# Patient Record
Sex: Female | Born: 1995 | Race: Black or African American | Hispanic: No | Marital: Single
Health system: Midwestern US, Community
[De-identification: ages and names within clinical notes are randomized; demographics above are authoritative.]

## PROBLEM LIST (undated history)

## (undated) HISTORY — PX: KNEE SURGERY: SHX244

---

## 2014-06-25 ENCOUNTER — Emergency Department (HOSPITAL_COMMUNITY): Payer: BLUE CROSS/BLUE SHIELD

## 2014-06-25 ENCOUNTER — Emergency Department (HOSPITAL_COMMUNITY)
Admission: EM | Admit: 2014-06-25 | Discharge: 2014-06-25 | Disposition: A | Discharge status: Home / Self Care | Payer: BLUE CROSS/BLUE SHIELD | Attending: Emergency Medicine | Admitting: Emergency Medicine

## 2014-06-25 ENCOUNTER — Encounter (HOSPITAL_COMMUNITY): Payer: Self-pay | Admitting: Emergency Medicine

## 2014-06-25 DIAGNOSIS — E663 Overweight: Secondary | ICD-10-CM | POA: Diagnosis not present

## 2014-06-25 DIAGNOSIS — R0789 Other chest pain: Secondary | ICD-10-CM | POA: Insufficient documentation

## 2014-06-25 DIAGNOSIS — R079 Chest pain, unspecified: Secondary | ICD-10-CM | POA: Diagnosis present

## 2014-06-25 LAB — D-DIMER, QUANTITATIVE: D-Dimer, Quant: <0.27 ug/mL-FEU (ref 0.00–0.48)

## 2014-06-25 LAB — TROPONIN I

## 2014-06-25 MED ORDER — IBUPROFEN 600 MG PO TABS: 600.0000 mg | ORAL_TABLET | Freq: Four times a day (QID) | ORAL | Status: DC | PRN

## 2014-06-25 MED ORDER — HYDROCODONE-ACETAMINOPHEN 5-325 MG PO TABS: 1.0000 | ORAL_TABLET | ORAL | Status: DC | PRN

## 2014-06-25 NOTE — Discharge Instructions (Signed)
Chest Wall Pain °Chest wall pain is pain felt in or around the chest bones and muscles. It may take up to 6 weeks to get better. It may take longer if you are active. Chest wall pain can happen on its own. Other times, things like germs, injury, coughing, or exercise can cause the pain. °HOME CARE  °· Avoid activities that make you tired or cause pain. Try not to use your chest, belly (abdominal), or side muscles. Do not use heavy weights. °· Put ice on the sore area. °¨ Put ice in a plastic bag. °¨ Place a towel between your skin and the bag. °¨ Leave the ice on for 15-20 minutes for the first 2 days. °· Only take medicine as told by your doctor. °GET HELP RIGHT AWAY IF:  °· You have more pain or are very uncomfortable. °· You have a fever. °· Your chest pain gets worse. °· You have new problems. °· You feel sick to your stomach (nauseous) or throw up (vomit). °· You start to sweat or feel lightheaded. °· You have a cough with mucus (phlegm). °· You cough up blood. °MAKE SURE YOU:  °· Understand these instructions. °· Will watch your condition. °· Will get help right away if you are not doing well or get worse. °Document Released: 09/08/2007 Document Revised: 06/14/2011 Document Reviewed: 11/16/2010 °[employer]® Patient Information ©2015 [employer]. This information is not intended to replace advice given to you by your health care provider. Make sure you discuss any questions you have with your health care provider. ° °
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
## 2014-06-25 NOTE — ED Notes (Signed)
Pt taken to XR.  

## 2014-06-25 NOTE — ED Provider Notes (Signed)
CSN: [id number]     Arrival date & time 06/25/14  0142 History  This chart was scribed for Shon Baton, MD by Annye Asa, ED Scribe. This patient was seen in room A01C/A01C and the patient's care was started at 2:10 AM.    Chief Complaint  Patient presents with  . Chest Pain   Patient is a 19 y.o. female presenting with chest pain. The history is provided by the patient. No language interpreter was used.  Chest Pain Associated symptoms: no abdominal pain, no cough, no fever, no headache, no nausea, no shortness of breath and not vomiting      HPI Comments: Judy Mcintosh is an otherwise healthy 19 y.o. female who presents to the Emergency Department complaining of 30 minutes of intermittent, left-sided "aching" chest pain, rated 5/10 at present. Patient states her pain initially worsened with sitting up; it has gradually worsened since that time. It is exacerbated with breathing and applied pressure. No treatments or medications tried PTA. She reports one prior experience with similar symptoms, at which time she was seen by her PCP but did not receive a conclusive diagnosis. She denies abdominal pain, nausea, vomiting, diarrhea, leg pain or swelling. She denies any recent long-term periods of immobility (e.g. no plane or car trips over 6-8 hours). She denies personal or familial history of PE or DVT.   LNMP beginning 06/20/14. Birth control: Nuvaring.   History reviewed. No pertinent past medical history. History reviewed. No pertinent past surgical history. History reviewed. No pertinent family history. History  Substance Use Topics  . Smoking status: Not on file  . Smokeless tobacco: Not on file  . Alcohol Use: Not on file   OB History    No data available     Review of Systems  Constitutional: Negative for fever.  Respiratory: Negative for cough, chest tightness and shortness of breath.   Cardiovascular: Positive for chest pain. Negative for leg swelling.  Gastrointestinal:  Negative for nausea, vomiting and abdominal pain.  Genitourinary: Negative for dysuria.  Skin: Negative for wound.  Neurological: Negative for headaches.  All other systems reviewed and are negative.  Allergies  Review of patient's allergies indicates no known allergies.  Home Medications   Prior to Admission medications   Medication Sig Start Date End Date Taking? Authorizing Provider  etonogestrel-ethinyl estradiol (NUVARING) 0.12-0.015 MG/24HR vaginal ring Place 1 each vaginally every 28 (twenty-eight) days. Insert vaginally and leave in place for 3 consecutive weeks, then remove for 1 week.   Yes Historical Provider, MD  HYDROcodone-acetaminophen (NORCO/VICODIN) 5-325 MG per tablet Take 1 tablet by mouth every 4 (four) hours as needed. 06/25/14   Shon Baton, MD  ibuprofen (ADVIL,MOTRIN) 600 MG tablet Take 1 tablet (600 mg total) by mouth every 6 (six) hours as needed. 06/25/14   Shon Baton, MD   BP 130/76 mmHg  Pulse 79  Temp(Src) 98.2 F (36.8 C) (Oral)  Resp 25  Ht  (1.702 m)  Wt 205 lb (92.987 kg)  BMI 32.10 kg/m2  SpO2 100%  LMP 06/18/2014 (Within Days) Physical Exam  Constitutional: She is oriented to person, place, and time. She appears well-developed and well-nourished.  Overweight  HENT:  Head: Normocephalic and atraumatic.  Eyes: Pupils are equal, round, and reactive to light.  Cardiovascular: Normal rate, regular rhythm and normal heart sounds.   Pulmonary/Chest: Effort normal. No respiratory distress. She has no wheezes. She exhibits tenderness.  Abdominal: Soft. Bowel sounds are normal. There is no tenderness.  There is no rebound.  Musculoskeletal: She exhibits no edema.  Neurological: She is alert and oriented to person, place, and time.  Skin: Skin is warm and dry.  Psychiatric: She has a normal mood and affect.  Nursing note and vitals reviewed.   ED Course  Procedures   DIAGNOSTIC STUDIES: Oxygen Saturation is 99% on RA, normal by my  interpretation.    COORDINATION OF CARE: 2:14 AM Discussed treatment plan with pt at bedside and pt agreed to plan.   Labs Review Labs Reviewed  TROPONIN I  D-DIMER, QUANTITATIVE    Imaging Review Dg Chest 2 View  06/25/2014   CLINICAL DATA:  Left-sided chest pain  EXAM: CHEST  2 VIEW  COMPARISON:  None.  FINDINGS: There is mild cardiomegaly. Lungs are clear. Pulmonary vasculature is normal. There are no pleural effusions.  IMPRESSION: Cardiomegaly.  No acute cardiopulmonary findings   Electronically Signed   By: Ellery Plunkaniel R Mitchell M.D.   On: 06/25/2014 03:06     EKG Interpretation   Date/Time:  Tuesday June 25 2014 01:53:12 EDT Ventricular Rate:  71 PR Interval:  141 QRS Duration: 101 QT Interval:  397 QTC Calculation: 431 R Axis:   86 Text Interpretation:  Sinus arrhythmia Borderline Q waves in inferior  leads No prior for comparison Confirmed by Claus Silvestro  MD, Aundrea Horace ([id number]) on  06/25/2014 2:50:11 AM      MDM   Final diagnoses:  Chest wall pain   Patient presents with chest pain. Nontoxic on exam. Vital signs are reassuring. Chest pain is reproducible on exam. Low suspicion at this time for ACS or PE. Patient is on birth control. EKG reassuring and chest x-ray unremarkable. Troponin and d-dimer negative. On recheck, patient reports improvement. Suspect chest wall pain. Patient instructed to use ibuprofen.  After history, exam, and medical workup I feel the patient has been appropriately medically screened and is safe for discharge home. Pertinent diagnoses were discussed with the patient. Patient was given return precautions.  I personally performed the services described in this documentation, which was scribed in my presence. The recorded information has been reviewed and is accurate.      Shon Batonourtney F Luisenrique Conran, MD 06/25/14 0400

## 2016-10-14 IMAGING — CR DG CHEST 2V
2 series · 2 of 2 positions shown · non-contrast
Comparison: None.

CLINICAL DATA: Left-sided chest pain

EXAM:
CHEST  2 VIEW

[chest pa]
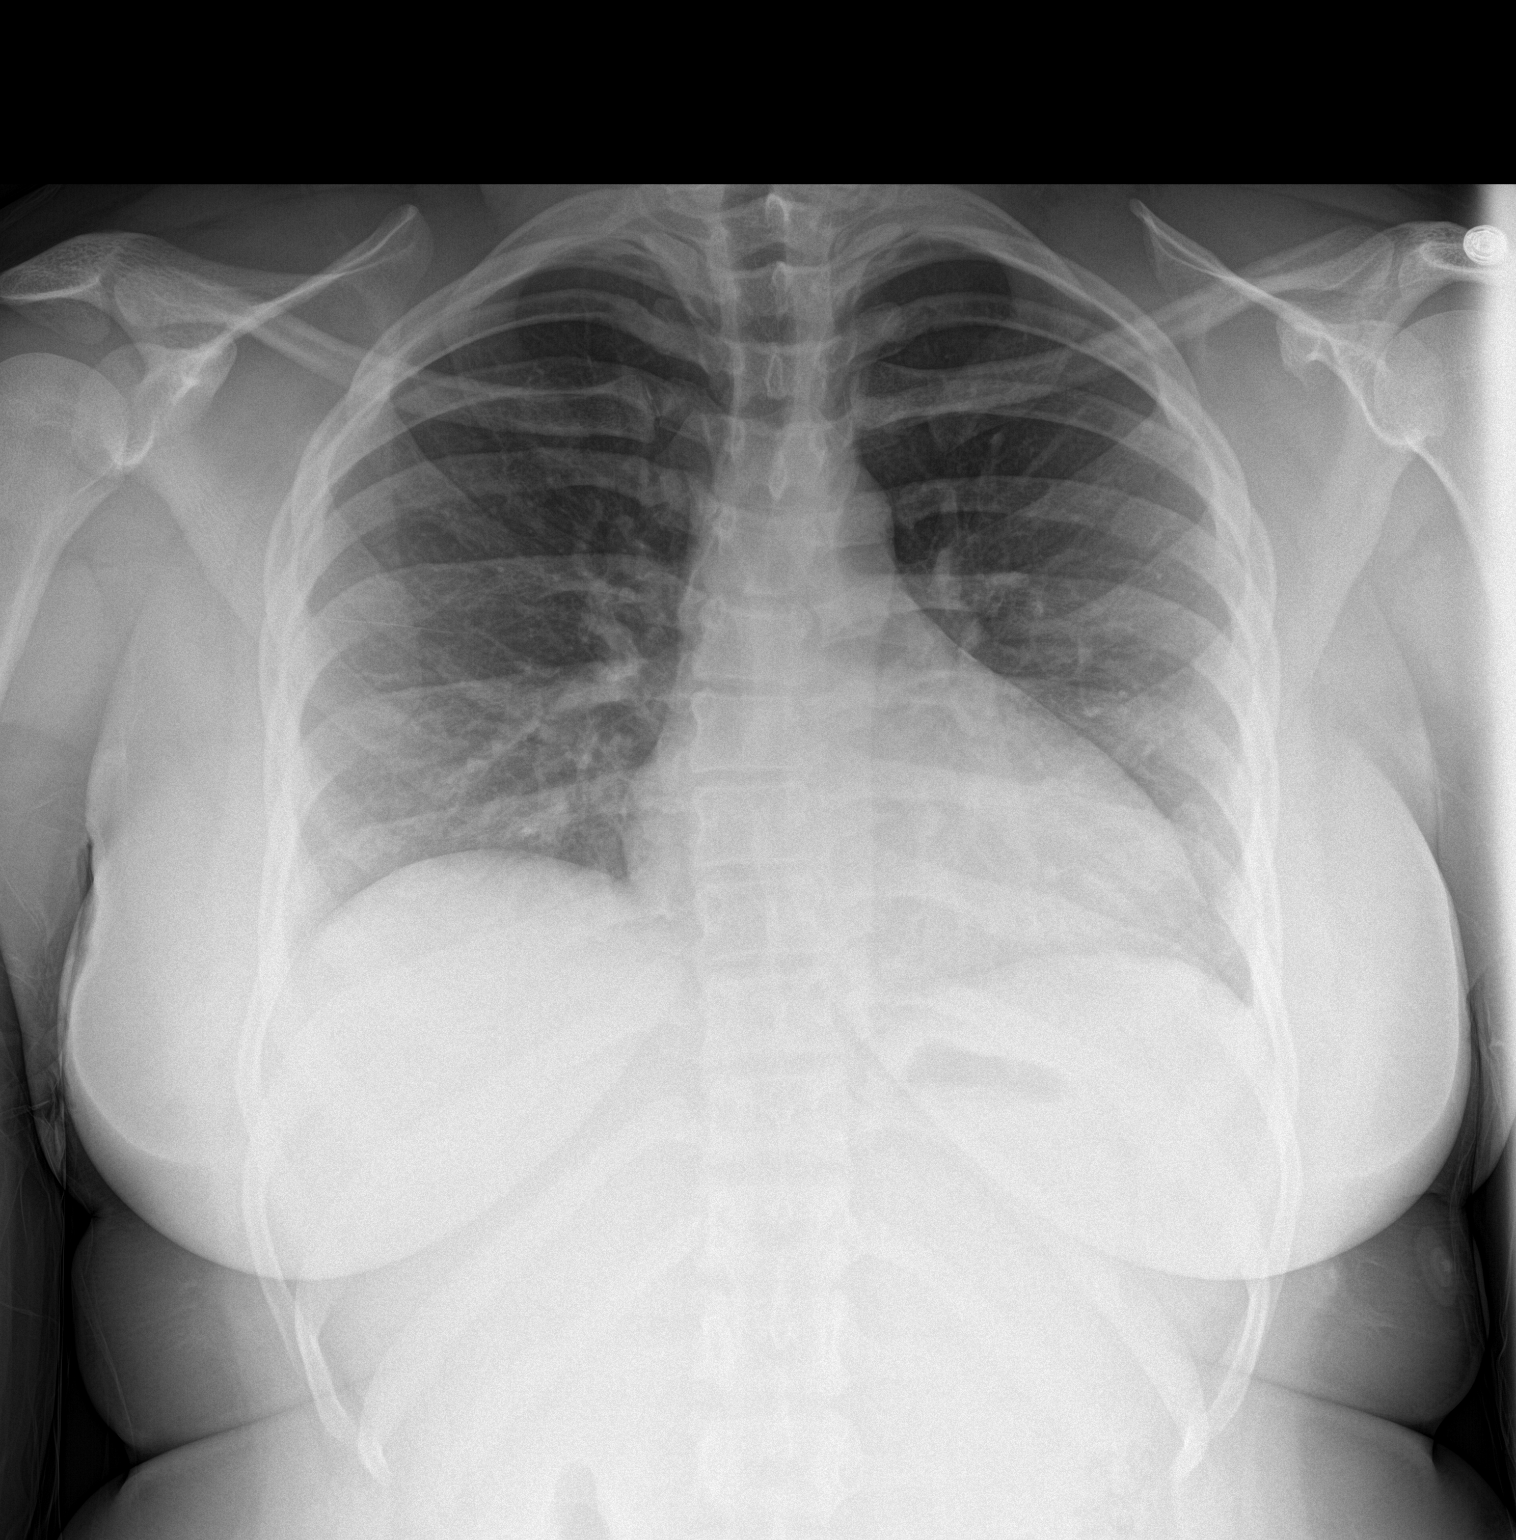

[chest lat]
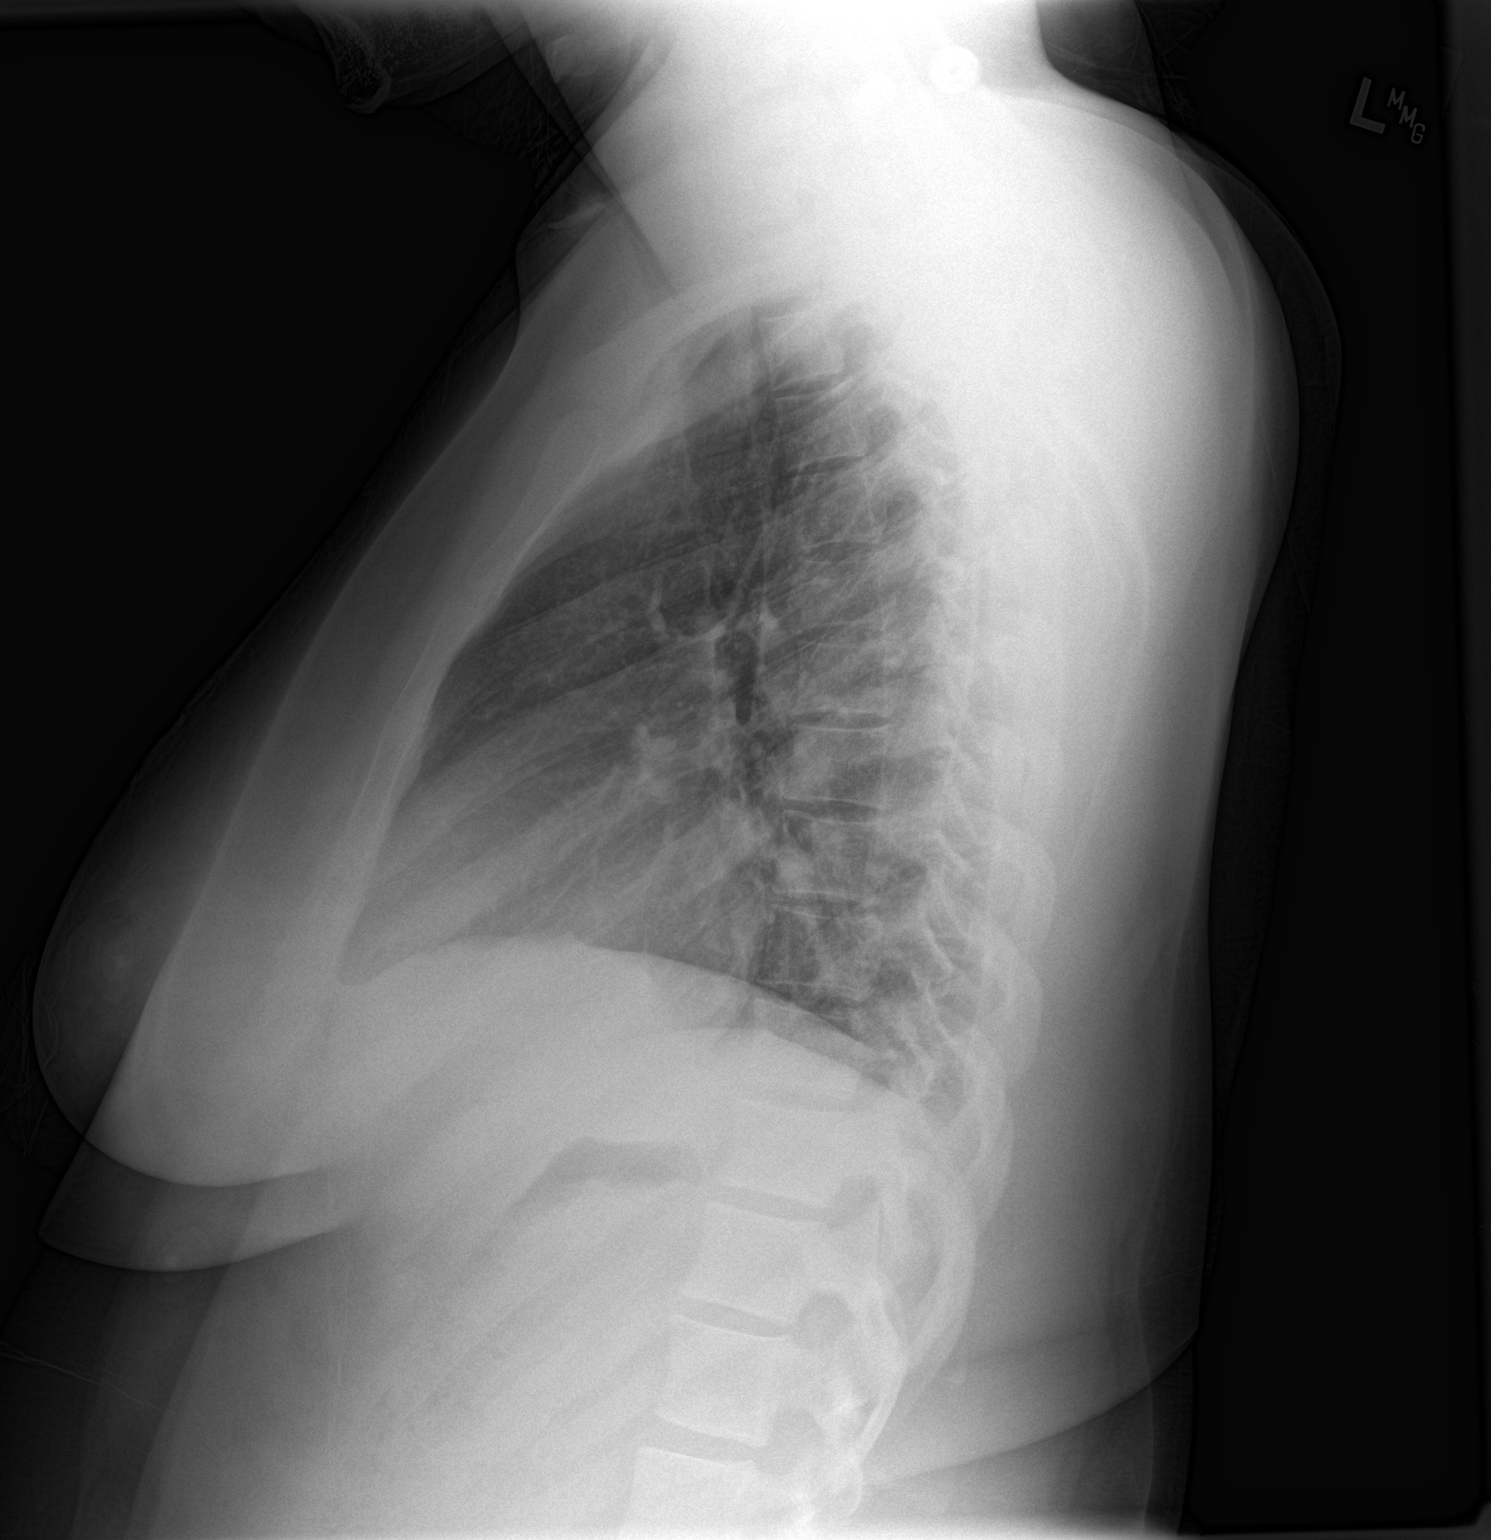

[2 of 2 positions shown; findings below may reference images not displayed]

FINDINGS: There is mild cardiomegaly. Lungs are clear. Pulmonary vasculature
is normal. There are no pleural effusions.
IMPRESSION: Cardiomegaly.  No acute cardiopulmonary findings

## 2017-01-24 ENCOUNTER — Emergency Department (HOSPITAL_BASED_OUTPATIENT_CLINIC_OR_DEPARTMENT_OTHER)
Admission: EM | Admit: 2017-01-24 | Discharge: 2017-01-24 | Disposition: A | Discharge status: Home / Self Care | Payer: Worker's Compensation | Attending: Emergency Medicine | Admitting: Emergency Medicine

## 2017-01-24 ENCOUNTER — Encounter (HOSPITAL_BASED_OUTPATIENT_CLINIC_OR_DEPARTMENT_OTHER): Payer: Self-pay | Admitting: Emergency Medicine

## 2017-01-24 DIAGNOSIS — W501XXA Accidental kick by another person, initial encounter: Secondary | ICD-10-CM | POA: Diagnosis not present

## 2017-01-24 DIAGNOSIS — S3992XA Unspecified injury of lower back, initial encounter: Secondary | ICD-10-CM | POA: Insufficient documentation

## 2017-01-24 DIAGNOSIS — Y990 Civilian activity done for income or pay: Secondary | ICD-10-CM | POA: Insufficient documentation

## 2017-01-24 DIAGNOSIS — Y92219 Unspecified school as the place of occurrence of the external cause: Secondary | ICD-10-CM | POA: Diagnosis not present

## 2017-01-24 DIAGNOSIS — Y9389 Activity, other specified: Secondary | ICD-10-CM | POA: Diagnosis not present

## 2017-01-24 MED ORDER — IBUPROFEN 600 MG PO TABS: 600.0000 mg | ORAL_TABLET | Freq: Four times a day (QID) | ORAL | 0 refills | Status: AC | PRN

## 2017-01-24 MED ORDER — CAMPHOR-MENTHOL-METHYL SAL 1.2-5.7-6.3 % EX PTCH: 1.0000 | MEDICATED_PATCH | Freq: Every day | CUTANEOUS | 0 refills | Status: AC

## 2017-01-24 MED FILL — IBUPROFEN 600 MG TABLET: 600 | 7 days supply | Qty: 30 | Fill #0

## 2017-01-24 MED FILL — SALONPAS GEL-PATCH HOT: 0.025-1.25 | 6 days supply | Qty: 6 | Fill #0
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
## 2017-01-24 NOTE — Discharge Instructions (Signed)
Please see the information and instructions below regarding your visit.  Your diagnoses today include:  1. Injury of back, initial encounter    About diagnosis. Most episodes of acute low back pain are self-limited. Your exam was reassuring today that the source of your pain is not affecting the spinal cord and nerves that originate in the spinal cord.   If you have a history of disc herniation or arthritis in your spine, the nerves exiting the spine on one side get inflamed. This can cause severe pain. We call this radiculopathy. We do not always know what causes the sudden inflammation.  Tests performed today include: See side panel of your discharge paperwork for testing performed today. Vital signs are listed at the bottom of these instructions.   Medications prescribed:    Take any prescribed medications only as prescribed, and any over the counter medications only as directed on the packaging.  You are prescribed ibuprofen, a non-steroidal anti-inflammatory agent (NSAID) for pain. You may take 600mg  every 6 hours as needed for pain. If still requiring this medication around the clock for acute pain after 10 days, please see your primary healthcare provider.  You may combine this medication with Tylenol, 650 mg every 6 hours, so you are receiving something for pain every 3 hours.  This is not a long-term medication unless under the care and direction of your primary provider. Taking this medication long-term and not under the supervision of a healthcare provider could increase the risk of stomach ulcers, kidney problems, and cardiovascular problems such as high blood pressure.    Home care instructions:   Low back pain gets worse the longer you stay stationary. Please keep moving and walking as tolerated. There are exercises included in this packet to perform as tolerated for your low back pain.   Apply heat to the areas that are painful. Avoid twisting or bending your trunk to lift  something. Do not lift anything above 25 lbs while recovering from this flare of low back pain.  Please follow any educational materials contained in this packet.   Follow-up instructions: Please follow-up with your primary care provider in one week for further evaluation of your symptoms if they are not completely improved. You may also have your pulse rechecked.  Return instructions:  Please return to the Emergency Department if you experience worsening symptoms.  Please return for any fever or chills in the setting of your back pain, weakness in the muscles of the legs, numbness in your legs and feet that is new or changing, numbness in the area where you wipe, retention of your urine, loss of bowel or bladder control, or problems with walking. Please return for any blood in your urine. Please return if you have any other emergent concerns.  Additional Information:   Your vital signs today were: BP (!) 144/85 (BP Location: Right Arm)    Pulse (!) 53    Temp 98.5 F (36.9 C) (Oral)    Resp 18    Ht 5\' 7"  (1.702 m)    Wt 99.3 kg (219 lb)    LMP 01/23/2017    SpO2 99%    BMI 34.30 kg/m  If your blood pressure (BP) was elevated on multiple readings during this visit above 130 for the top number or above 80 for the bottom number, please have this repeated by your primary care provider within one month. --------------  Thank you for allowing us to participate in your care today.

## 2020-07-21 ENCOUNTER — Emergency Department: Admit: 2020-07-21 | Payer: BLUE CROSS/BLUE SHIELD

## 2020-07-21 ENCOUNTER — Inpatient Hospital Stay
Admit: 2020-07-21 | Discharge: 2020-07-21 | Disposition: A | Discharge status: Home / Self Care | Payer: BLUE CROSS/BLUE SHIELD | Attending: Emergency Medicine

## 2020-07-21 DIAGNOSIS — E876 Hypokalemia: Secondary | ICD-10-CM

## 2020-07-21 LAB — METABOLIC PANEL, COMPREHENSIVE
A-G Ratio: 0.9 — ABNORMAL LOW (ref 1.2–3.5)
ALT (SGPT): 16 U/L (ref 12–65)
AST (SGOT): 12 U/L — ABNORMAL LOW (ref 15–37)
Albumin: 3.4 g/dL — ABNORMAL LOW (ref 3.5–5.0)
Alk. phosphatase: 61 U/L (ref 50–130)
Anion gap: 7 mmol/L (ref 7–16)
BUN: 9 MG/DL (ref 6–23)
Bilirubin, total: 1.6 MG/DL — ABNORMAL HIGH (ref 0.2–1.1)
CO2: 25 mmol/L (ref 21–32)
Calcium: 8.7 MG/DL (ref 8.3–10.4)
Chloride: 107 mmol/L (ref 98–107)
Creatinine: 0.8 MG/DL (ref 0.6–1.0)
GFR est AA: >60 mL/min/{1.73_m2} (ref >60)
GFR est non-AA: >60 mL/min/{1.73_m2} (ref >60)
Globulin: 3.9 g/dL — ABNORMAL HIGH (ref 2.3–3.5)
Glucose: 109 mg/dL — ABNORMAL HIGH (ref 65–100)
Potassium: 2.8 mmol/L — ABNORMAL LOW (ref 3.5–5.1)
Protein, total: 7.3 g/dL (ref 6.3–8.2)
Sodium: 139 mmol/L (ref 136–145)

## 2020-07-21 LAB — CBC WITH AUTOMATED DIFF
ABS. BASOPHILS: 0 10*3/uL (ref 0.0–0.2)
ABS. EOSINOPHILS: 0 10*3/uL (ref 0.0–0.8)
ABS. IMM. GRANS.: 0 10*3/uL (ref 0.0–0.5)
ABS. LYMPHOCYTES: 1 10*3/uL (ref 0.5–4.6)
ABS. MONOCYTES: 0.5 10*3/uL (ref 0.1–1.3)
ABS. NEUTROPHILS: 5.7 10*3/uL (ref 1.7–8.2)
ABSOLUTE NRBC: 0 10*3/uL (ref 0.0–0.2)
BASOPHILS: 0 % (ref 0.0–2.0)
EOSINOPHILS: 0 % — ABNORMAL LOW (ref 0.5–7.8)
HCT: 38.7 % (ref 35.8–46.3)
HGB: 13 g/dL (ref 11.7–15.4)
IMMATURE GRANULOCYTES: 0 % (ref 0.0–5.0)
LYMPHOCYTES: 14 % (ref 13–44)
MCH: 28.8 PG (ref 26.1–32.9)
MCHC: 33.6 g/dL (ref 31.4–35.0)
MCV: 85.6 FL (ref 79.6–97.8)
MONOCYTES: 6 % (ref 4.0–12.0)
MPV: 9.8 FL (ref 9.4–12.3)
NEUTROPHILS: 79 % — ABNORMAL HIGH (ref 43–78)
PLATELET: 247 10*3/uL (ref 150–450)
RBC: 4.52 M/uL (ref 4.05–5.2)
RDW: 12.6 % (ref 11.9–14.6)
WBC: 7.2 10*3/uL (ref 4.3–11.1)

## 2020-07-21 LAB — MAGNESIUM
Magnesium: 1.7 mg/dL — ABNORMAL LOW (ref 1.8–2.4)
Magnesium: 1.7 mg/dL — ABNORMAL LOW (ref 1.8–2.4)

## 2020-07-21 LAB — HCG URINE, QL. - POC
HCG, Pregnancy, Urine, POC: NEGATIVE
Pregnancy test,urine (POC): NEGATIVE

## 2020-07-21 LAB — LIPASE
Lipase: 51 U/L — ABNORMAL LOW (ref 73–393)
Lipase: 51 U/L — ABNORMAL LOW (ref 73–393)

## 2020-07-21 LAB — CBC WITH AUTO DIFFERENTIAL
Basophils %: 0 % (ref 0.0–2.0)
Basophils Absolute: 0 10*3/uL (ref 0.0–0.2)
Eosinophils %: 0 % — ABNORMAL LOW (ref 0.5–7.8)
Eosinophils Absolute: 0 10*3/uL (ref 0.0–0.8)
Granulocyte Absolute Count: 0 10*3/uL (ref 0.0–0.5)
Hematocrit: 38.7 % (ref 35.8–46.3)
Hemoglobin: 13 g/dL (ref 11.7–15.4)
Immature Granulocytes: 0 % (ref 0.0–5.0)
Lymphocytes %: 14 % (ref 13–44)
Lymphocytes Absolute: 1 10*3/uL (ref 0.5–4.6)
MCH: 28.8 PG (ref 26.1–32.9)
MCHC: 33.6 g/dL (ref 31.4–35.0)
MCV: 85.6 FL (ref 79.6–97.8)
MPV: 9.8 FL (ref 9.4–12.3)
Monocytes %: 6 % (ref 4.0–12.0)
Monocytes Absolute: 0.5 10*3/uL (ref 0.1–1.3)
NRBC Absolute: 0 10*3/uL (ref 0.0–0.2)
Neutrophils %: 79 % — ABNORMAL HIGH (ref 43–78)
Neutrophils Absolute: 5.7 10*3/uL (ref 1.7–8.2)
Platelets: 247 10*3/uL (ref 150–450)
RBC: 4.52 M/uL (ref 4.05–5.2)
RDW: 12.6 % (ref 11.9–14.6)
WBC: 7.2 10*3/uL (ref 4.3–11.1)

## 2020-07-21 LAB — COMPREHENSIVE METABOLIC PANEL
ALT: 16 U/L (ref 12–65)
AST: 12 U/L — ABNORMAL LOW (ref 15–37)
Albumin/Globulin Ratio: 0.9 — ABNORMAL LOW (ref 1.2–3.5)
Albumin: 3.4 g/dL — ABNORMAL LOW (ref 3.5–5.0)
Alkaline Phosphatase: 61 U/L (ref 50–130)
Anion Gap: 7 mmol/L (ref 7–16)
BUN: 9 MG/DL (ref 6–23)
CO2: 25 mmol/L (ref 21–32)
Calcium: 8.7 MG/DL (ref 8.3–10.4)
Chloride: 107 mmol/L (ref 98–107)
Creatinine: 0.8 MG/DL (ref 0.6–1.0)
EGFR IF NonAfrican American: >60 mL/min/{1.73_m2} (ref >60)
GFR African American: >60 mL/min/{1.73_m2} (ref >60)
Globulin: 3.9 g/dL — ABNORMAL HIGH (ref 2.3–3.5)
Glucose: 109 mg/dL — ABNORMAL HIGH (ref 65–100)
Potassium: 2.8 mmol/L — ABNORMAL LOW (ref 3.5–5.1)
Sodium: 139 mmol/L (ref 136–145)
Total Bilirubin: 1.6 MG/DL — ABNORMAL HIGH (ref 0.2–1.1)
Total Protein: 7.3 g/dL (ref 6.3–8.2)

## 2020-07-21 MED ORDER — SALINE PERIPHERAL FLUSH PRN
Freq: Once | INTRAMUSCULAR | Status: AC
Start: 2020-07-21 — End: 2020-07-21
  Administered 2020-07-21: 07:00:00

## 2020-07-21 MED ORDER — SODIUM CHLORIDE 0.9 % IJ SYRG
Freq: Three times a day (TID) | INTRAMUSCULAR | Status: DC
Start: 2020-07-21 — End: 2020-07-21

## 2020-07-21 MED ORDER — SODIUM CHLORIDE 0.9% BOLUS IV
0.9 % | Freq: Once | INTRAVENOUS | Status: AC
Start: 2020-07-21 — End: 2020-07-21
  Administered 2020-07-21: 07:00:00 via INTRAVENOUS

## 2020-07-21 MED ORDER — POTASSIUM BICARBONATE-CITRIC ACID 10 MEQ EFFERVESCENT TABLET
10 mEq | ORAL | Status: AC
Start: 2020-07-21 — End: 2020-07-21
  Administered 2020-07-21: 07:00:00 via ORAL

## 2020-07-21 MED ORDER — IOPAMIDOL 76 % IV SOLN
76 % | Freq: Once | INTRAVENOUS | Status: AC
Start: 2020-07-21 — End: 2020-07-21
  Administered 2020-07-21: 07:00:00 via INTRAVENOUS

## 2020-07-21 MED ORDER — ONDANSETRON 4 MG TAB, RAPID DISSOLVE
4 mg | ORAL_TABLET | Freq: Three times a day (TID) | ORAL | 0 refills | Status: AC | PRN
Start: 2020-07-21 — End: ?

## 2020-07-21 MED ORDER — POTASSIUM CHLORIDE SR 20 MEQ TAB, PARTICLES/CRYSTALS
20 mEq | ORAL_TABLET | Freq: Every day | ORAL | 0 refills | Status: AC
Start: 2020-07-21 — End: ?

## 2020-07-21 MED ORDER — MAGNESIUM SULFATE 2 GRAM/50 ML IVPB
2 gram/50 mL (4 %) | INTRAVENOUS | Status: AC
Start: 2020-07-21 — End: 2020-07-21
  Administered 2020-07-21: 08:00:00 via INTRAVENOUS

## 2020-07-21 MED ORDER — ONDANSETRON (PF) 4 MG/2 ML INJECTION
4 mg/2 mL | INTRAMUSCULAR | Status: AC
Start: 2020-07-21 — End: 2020-07-21
  Administered 2020-07-21: 07:00:00 via INTRAVENOUS

## 2020-07-21 MED ORDER — KETOROLAC TROMETHAMINE 30 MG/ML INJECTION
30 mg/mL (1 mL) | INTRAMUSCULAR | Status: AC
Start: 2020-07-21 — End: 2020-07-21
  Administered 2020-07-21: 07:00:00 via INTRAVENOUS

## 2020-07-21 MED ORDER — POTASSIUM BICARBONATE-CITRIC ACID 10 MEQ EFFERVESCENT TABLET
10 mEq | ORAL | Status: AC
Start: 2020-07-21 — End: 2020-07-21
  Administered 2020-07-21: 08:00:00 via ORAL

## 2020-07-21 MED ORDER — SODIUM CHLORIDE 0.9 % IJ SYRG
INTRAMUSCULAR | Status: DC | PRN
Start: 2020-07-21 — End: 2020-07-21

## 2020-07-21 MED FILL — ONDANSETRON (PF) 4 MG/2 ML INJECTION: 4 mg/2 mL | INTRAMUSCULAR | Qty: 2

## 2020-07-21 MED FILL — POTASSIUM BICARBONATE-CITRIC ACID 10 MEQ EFFERVESCENT TABLET: 10 mEq | ORAL | Qty: 4

## 2020-07-21 MED FILL — KETOROLAC TROMETHAMINE 30 MG/ML INJECTION: 30 mg/mL (1 mL) | INTRAMUSCULAR | Qty: 1

## 2020-07-21 MED FILL — MAGNESIUM SULFATE 2 GRAM/50 ML IVPB: 2 gram/50 mL (4 %) | INTRAVENOUS | Qty: 50

## 2020-07-21 NOTE — ED Notes (Signed)
Pt presents to the ED for c/o abd pain for 2 days. States that she has had nausea and vomiting and the pain became more severe today

## 2020-07-21 NOTE — ED Notes (Signed)
I have reviewed discharge instructions with the patient.  The patient verbalized understanding.    Patient left ED via Discharge Method: ambulatory to Home with (friend, self).    Opportunity for questions and clarification provided.       Patient given 2 scripts.         To continue your aftercare when you leave the hospital, you may receive an automated call from our care team to check in on how you are doing.  This is a free service and part of our promise to provide the best care and service to meet your aftercare needs." If you have questions, or wish to unsubscribe from this service please call [phone].  Thank you for Choosing our [facility] Emergency Department.

## 2020-07-21 NOTE — ED Provider Notes (Signed)
25 year old female with no pertinent past medical history presents with complaint of intermittent nausea, vomiting, diarrhea with generalized abdominal discomfort that is worsened over the past 24 to 48 hours.  Denies any recent sick contacts.  Denies melena, \\hematochezia, fever, chills, chest pain, shortness of breath, cough, vaginal bleeding, vaginal discharge, pelvic pain.  Patient states that she completed her period last week.  Denies any recent antibiotic use.  Denies recent foreign travel.      The history is provided by the patient. No language interpreter was used.   Abdominal Pain   This is a new problem. The current episode started 2 days ago. The problem occurs constantly. The problem has not changed since onset.The pain is located in the generalized abdominal region. The quality of the pain is cramping. The pain is at a severity of 2/10. The pain is mild. Associated symptoms include diarrhea, nausea and vomiting. Pertinent negatives include no anorexia, no fever, no belching, no flatus, no hematochezia, no melena, no constipation, no dysuria, no frequency, no hematuria, no headaches, no arthralgias, no myalgias, no chest pain and no back pain. Nothing worsens the pain. The pain is relieved by nothing.        History reviewed. No pertinent past medical history.    No past surgical history on file.      History reviewed. No pertinent family history.    Social History     Socioeconomic History   ??? Marital status: SINGLE     Spouse name: Not on file   ??? Number of children: Not on file   ??? Years of education: Not on file   ??? Highest education level: Not on file   Occupational History   ??? Not on file   Tobacco Use   ??? Smoking status: Not on file   ??? Smokeless tobacco: Not on file   Substance and Sexual Activity   ??? Alcohol use: Not on file   ??? Drug use: Not on file   ??? Sexual activity: Not on file   Other Topics Concern   ??? Not on file   Social History Narrative   ??? Not on file     Social Determinants of  Health     Financial Resource Strain:    ??? Difficulty of Paying Living Expenses: Not on file   Food Insecurity:    ??? Worried About Running Out of Food in the Last Year: Not on file   ??? Ran Out of Food in the Last Year: Not on file   Transportation Needs:    ??? Lack of Transportation (Medical): Not on file   ??? Lack of Transportation (Non-Medical): Not on file   Physical Activity:    ??? Days of Exercise per Week: Not on file   ??? Minutes of Exercise per Session: Not on file   Stress:    ??? Feeling of Stress : Not on file   Social Connections:    ??? Frequency of Communication with Friends and Family: Not on file   ??? Frequency of Social Gatherings with Friends and Family: Not on file   ??? Attends Religious Services: Not on file   ??? Active Member of Clubs or Organizations: Not on file   ??? Attends Archivist Meetings: Not on file   ??? Marital Status: Not on file   Intimate Partner Violence:    ??? Fear of Current or Ex-Partner: Not on file   ??? Emotionally Abused: Not on file   ??? Physically  Abused: Not on file   ??? Sexually Abused: Not on file   Housing Stability:    ??? Unable to Pay for Housing in the Last Year: Not on file   ??? Number of Places Lived in the Last Year: Not on file   ??? Unstable Housing in the Last Year: Not on file         ALLERGIES: Patient has no known allergies.    Review of Systems   Constitutional: Negative for chills, fatigue and fever.   HENT: Negative for congestion and rhinorrhea.    Respiratory: Negative for cough and shortness of breath.    Cardiovascular: Negative for chest pain.   Gastrointestinal: Positive for abdominal pain, diarrhea, nausea and vomiting. Negative for anorexia, blood in stool, constipation, flatus, hematochezia and melena.   Genitourinary: Negative for dysuria, flank pain, frequency, hematuria, pelvic pain, vaginal bleeding, vaginal discharge and vaginal pain.   Musculoskeletal: Negative for arthralgias, back pain and myalgias.   Skin: Negative for color change and rash.    Neurological: Negative for dizziness, weakness, light-headedness and headaches.   Hematological: Does not bruise/bleed easily.       Vitals:    07/21/20 0118   BP: (!) 170/99   Pulse: 81   Resp: 16   Temp: 98.9 ??F (37.2 ??C)   SpO2: 97%   Weight: 99.8 kg (220 lb)   Height: _0  (1.702 m)            Physical Exam  Vitals and nursing note reviewed.   Constitutional:       Appearance: She is well-developed.      Comments: Patient nontoxic in appearance.   HENT:      Head: Normocephalic.      Mouth/Throat:      Mouth: Mucous membranes are moist.      Pharynx: No oropharyngeal exudate.   Eyes:      Extraocular Movements: Extraocular movements intact.      Pupils: Pupils are equal, round, and reactive to light.   Cardiovascular:      Rate and Rhythm: Normal rate and regular rhythm.      Heart sounds: Normal heart sounds.   Pulmonary:      Effort: Pulmonary effort is normal.      Breath sounds: Normal breath sounds.   Abdominal:      General: Abdomen is flat. Bowel sounds are normal.      Palpations: Abdomen is soft.      Tenderness: There is generalized abdominal tenderness. There is no right CVA tenderness, left CVA tenderness, guarding or rebound. Negative signs include Murphy's sign and McBurney's sign.      Comments: Soft.  Mild diffuse tenderness duration.  No rebound or guarding.  No peritoneal signs.  No CVA tenderness.     Skin:     General: Skin is warm.      Findings: No rash.   Neurological:      General: No focal deficit present.      Mental Status: She is alert and oriented to person, place, and time.      Motor: No weakness.          MDM  Number of Diagnoses or Management Options  Abdominal pain, generalized: new and requires workup  Hypokalemia: new and requires workup  Hypomagnesemia: new and requires workup  Nausea, vomiting, and diarrhea: new and requires workup  Polycystic kidney disease  Diagnosis management comments: Blood pressure slightly elevated.  Will recheck.  No leukocytosis.  Patient noted  to be hypokalemic with potassium of 2.9.  Magnesium 1.7.   Patient given IV fluid hydration.  Patient given 40 mEq of EFFER-K x2. Mag 2g IV given.  UPT negative.  UA negative for UTI.  CT abdomen pelvis with no acute abnormality noted.  The appearance of the kidneys is highly suggestive of polycystic kidney disease.  Patient states that her mother has polycystic kidney disease.  States that she has never been told that she has polycystic kidney disease.  Patient states that she went to schedule close follow-up with primary care physician.  Also instructed that she will need to follow-up with GI in regards to recent issues.  Abdomen soft, nontender with no rebound or guarding on reassessment.  Patient tolerating p.o.       Amount and/or Complexity of Data Reviewed  Clinical lab tests: ordered and reviewed  Tests in the radiology section of CPT??: ordered and reviewed  Tests in the medicine section of CPT??: ordered and reviewed  Review and summarize past medical records: yes  Independent visualization of images, tracings, or specimens: yes    Risk of Complications, Morbidity, and/or Mortality  Presenting problems: moderate  Diagnostic procedures: moderate  Management options: moderate  General comments: Results Include:    Recent Results (from the past 24 hour(s))  -HCG URINE, QL. - POC:   Collection Time: 07/21/20  1:27 AM       Result                      Value             Ref Range           Pregnancy test,urine (*     Negative          NEG            -CBC WITH AUTOMATED DIFF:   Collection Time: 07/21/20  1:50 AM       Result                      Value             Ref Range           WBC                         7.2               4.3 - 11.1 K*       RBC                         4.52              4.05 - 5.2 M*       HGB                         13.0              11.7 - 15.4 *       HCT                         38.7              35.8 - 46.3 %       MCV  85.6              79.6 - 97.8 *       MCH                          28.8              26.1 - 32.9 *       MCHC                        33.6              31.4 - 35.0 *       RDW                         12.6              11.9 - 14.6 %       PLATELET                    247               150 - 450 K/*       MPV                         9.8               9.4 - 12.3 FL       ABSOLUTE NRBC               0.00              0.0 - 0.2 K/*       DF                          AUTOMATED                             NEUTROPHILS                 79 (H)            43 - 78 %           LYMPHOCYTES                 14                13 - 44 %           MONOCYTES                   6                 4.0 - 12.0 %        EOSINOPHILS                 0 (L)             0.5 - 7.8 %         BASOPHILS                   0                 0.0 - 2.0 %         IMMATURE GRANULOCYTES       0  0.0 - 5.0 %         ABS. NEUTROPHILS            5.7               1.7 - 8.2 K/*       ABS. LYMPHOCYTES            1.0               0.5 - 4.6 K/*       ABS. MONOCYTES              0.5               0.1 - 1.3 K/*       ABS. EOSINOPHILS            0.0               0.0 - 0.8 K/*       ABS. BASOPHILS              0.0               0.0 - 0.2 K/*       ABS. IMM. GRANS.            0.0               0.0 - 0.5 K/*  -METABOLIC PANEL, COMPREHENSIVE:   Collection Time: 07/21/20  1:50 AM       Result                      Value             Ref Range           Sodium                      139               136 - 145 mm*       Potassium                   2.8 (L)           3.5 - 5.1 mm*       Chloride                    107               98 - 107 mmo*       CO2                         25                21 - 32 mmol*       Anion gap                   7                 7 - 16 mmol/L       Glucose                     109 (H)           65 - 100 mg/*       BUN  9                 6 - 23 MG/DL        Creatinine                  0.80              0.6 - 1.0 MG*       GFR est AA                  >60                >60 ml/min/1*       GFR est non-AA              >60               >60 ml/min/1*       Calcium                     8.7               8.3 - 10.4 M*       Bilirubin, total            1.6 (H)           0.2 - 1.1 MG*       ALT (SGPT)                  16                12 - 65 U/L         AST (SGOT)                  12 (L)            15 - 37 U/L         Alk. phosphatase            61                50 - 130 U/L        Protein, total              7.3               6.3 - 8.2 g/*       Albumin                     3.4 (L)           3.5 - 5.0 g/*       Globulin                    3.9 (H)           2.3 - 3.5 g/*       A-G Ratio                   0.9 (L)           1.2 - 3.5      -LIPASE:   Collection Time: 07/21/20  1:50 AM       Result                      Value             Ref Range           Lipase  51 (L)            73 - 393 U/L   -MAGNESIUM:   Collection Time: 07/21/20  1:50 AM       Result                      Value             Ref Range           Magnesium                   1.7 (L)           1.8 - 2.4 mg*      Patient Progress  Patient progress: stable    ED Course as of 07/21/20 0432   Mon Jul 21, 2020   0232 Potassium(!): 2.8 [DF]   0232 Bilirubin, total(!): 1.6 [DF]   0347 CT abdomen/pelvis w/ IV contrast FINDINGS:   The visualized lung bases and mediastinum are unremarkable.  ??  The liver, spleen, pancreas, adrenal glands, gallbladder, are within normal  limits.  ??  Both kidneys are enlarged and contain innumerable circumscribed hypodense  lesions which likely represent cysts. The largest lesion on the right measures  3.9 x 3.8 cm and is located in the mid pole. The largest on the left is in the  lower pole and measures 5.1 x 5.0 cm.  The bladder appears grossly normal.  ??  Distal esophagus and stomach are unremarkable. Small and large bowel are without  evidence of obstruction. Appendix is not visualized. There is no evidence of  localized inflammatory process at the level of the  cecum.  ??  The uterus is visualized and appears unremarkable. Ovaries are not defined.  ??  No evidence of free fluid, free air, focal fluid collection. No pathologic  adenopathy. No abdominal aortic aneurysm.  ??  Osseous structures are without evidence of acute fracture or suspicious lesion.   ??  IMPRESSION  No CT evidence of an acute abnormality in the abdomen or pelvis.  ??  The appearance of the kidneys is highly suggestive of polycystic renal disease.  ??  Other findings as above. [DF]      ED Course User Index  [DF] Flowers, Erez Mccallum Rich Reining., MD       Procedures             Dai Apel Steger Shirlean Schlein., MD; 07/21/2020 _0 :33 AM Voice dictation software was used during the making of this note.  This software is not perfect and grammatical and other typographical errors may be present.  This note has not been proofread for errors.  ===================================================================

## 2020-07-21 NOTE — ED Notes (Signed)
vomiting yesterday and nauseated today/ pt got ready for bed and started having sever pain above her naval and radiates across her abdomen / pt last bm was yesterday - diarrhea  Pt completed her period last week
